# Patient Record
Sex: Female | Born: 1995 | Race: White | Hispanic: No | Marital: Single | Smoking: Current every day smoker
Health system: Southern US, Community
[De-identification: ages and names within clinical notes are randomized; demographics above are authoritative.]

---

## 2018-01-18 DIAGNOSIS — O99332 Smoking (tobacco) complicating pregnancy, second trimester: Secondary | ICD-10-CM | POA: Insufficient documentation

## 2018-01-18 DIAGNOSIS — O4692 Antepartum hemorrhage, unspecified, second trimester: Secondary | ICD-10-CM | POA: Insufficient documentation

## 2018-01-18 DIAGNOSIS — Z3201 Encounter for pregnancy test, result positive: Secondary | ICD-10-CM | POA: Insufficient documentation

## 2018-01-18 DIAGNOSIS — F17200 Nicotine dependence, unspecified, uncomplicated: Secondary | ICD-10-CM | POA: Insufficient documentation

## 2018-01-18 DIAGNOSIS — Z79899 Other long term (current) drug therapy: Secondary | ICD-10-CM | POA: Insufficient documentation

## 2018-01-19 ENCOUNTER — Telehealth: Payer: Self-pay | Admitting: Obstetrics & Gynecology

## 2018-01-19 ENCOUNTER — Emergency Department (HOSPITAL_COMMUNITY)
Admission: EM | Admit: 2018-01-19 | Discharge: 2018-01-19 | Disposition: A | Discharge status: Home / Self Care | Payer: Self-pay | Attending: Emergency Medicine | Admitting: Emergency Medicine

## 2018-01-19 ENCOUNTER — Emergency Department (HOSPITAL_COMMUNITY): Payer: Self-pay

## 2018-01-19 ENCOUNTER — Other Ambulatory Visit: Payer: Self-pay

## 2018-01-19 ENCOUNTER — Encounter (HOSPITAL_COMMUNITY): Payer: Self-pay | Admitting: *Deleted

## 2018-01-19 DIAGNOSIS — N939 Abnormal uterine and vaginal bleeding, unspecified: Secondary | ICD-10-CM

## 2018-01-19 DIAGNOSIS — O4690 Antepartum hemorrhage, unspecified, unspecified trimester: Secondary | ICD-10-CM

## 2018-01-19 LAB — URINALYSIS, ROUTINE W REFLEX MICROSCOPIC
Bilirubin Urine: NEGATIVE
GLUCOSE, UA: NEGATIVE mg/dL
HGB URINE DIPSTICK: NEGATIVE
Ketones, ur: 5 mg/dL — AB
Leukocytes, UA: NEGATIVE
Nitrite: NEGATIVE
PH: 6 (ref 5.0–8.0)
Protein, ur: NEGATIVE mg/dL
SPECIFIC GRAVITY, URINE: 1.027 (ref 1.005–1.030)

## 2018-01-19 LAB — GC/CHLAMYDIA PROBE AMP (~~LOC~~) NOT AT ARMC
CHLAMYDIA, DNA PROBE: NEGATIVE
Neisseria Gonorrhea: NEGATIVE

## 2018-01-19 LAB — WET PREP, GENITAL
Sperm: NONE SEEN
TRICH WET PREP: NONE SEEN
YEAST WET PREP: NONE SEEN

## 2018-01-19 LAB — POC URINE PREG, ED: Preg Test, Ur: POSITIVE — AB

## 2018-01-19 MED ORDER — SODIUM CHLORIDE 0.9 % IV BOLUS: 2000.0000 mL | Freq: Once | INTRAVENOUS | Status: DC

## 2018-01-19 MED ORDER — PRENATAL COMPLETE 14-0.4 MG PO TABS: 2.0000 | ORAL_TABLET | Freq: Every day | ORAL | 0 refills | Status: AC

## 2018-01-19 MED ORDER — SODIUM CHLORIDE 0.9 % IV BOLUS
1000.0000 mL | Freq: Once | INTRAVENOUS | Status: AC
  Administered 2018-01-19: 1000 mL via INTRAVENOUS

## 2018-01-19 NOTE — ED Provider Notes (Signed)
[facility]-EMERGENCY DEPT Provider Note   CSN: [id number] Arrival date & time: 01/18/18  2345     History   Chief Complaint Chief Complaint  Patient presents with  . Abdominal Pain    HPI Ellen Walls is a 22 y.o. female G2P1001 with a hx of no major medical problems presents to the Emergency Department complaining of gradual, persistent but improved lower abdominal cramping and one episode of vaginal bleeding.  Describes this as scant, dark blood.  She reports associated decreased fetal movement for the last 2 days.  She denies leakage of fluids.  Patient reports the last time she urinated she did not see blood.  She denies blood with urination or on the toilet paper after urination.  She denies vaginal discharge, dysuria, hematuria.  Patient reports that she has moved frequently over the last few months and therefore has not established an OB/GYN.  She reports her prenatal care has come from multiple different emergency rooms.  She reports that she does not have that paperwork with her.  She states she is [redacted] weeks pregnant.  Patient denies known aggravating or alleviating factors.  She reports she is drinking " enough" water but when questioned about this it seems she is drinking 5-6 bottles of 12 ounce water per day.  She denies fever, chills, headache, neck pain, chest pain, shortness of breath, diarrhea, weakness, dizziness, syncope.  Patient does report that she often vomits in the middle of the night due to her severe acid reflux.  Pt denies recent sexual intercourse.    The history is provided by the patient, medical records and a significant other. No language interpreter was used.    History reviewed. No pertinent past medical history.  There are no active problems to display for this patient.   History reviewed. No pertinent surgical history.   OB History    Gravida  1   Para      Term      Preterm      AB      Living        SAB      TAB      Ectopic      Multiple      Live Births               Home Medications    Prior to Admission medications   Medication Sig Start Date End Date Taking? Authorizing Provider  Prenatal Vit-Fe Fumarate-FA (PRENATAL COMPLETE) 14-0.4 MG TABS Take 2 tablets by mouth daily. 01/19/18   Kroy Sprung, Dahlia Client, PA-C    Family History No family history on file.  Social History Social History   Tobacco Use  . Smoking status: Current Every Day Smoker  Substance Use Topics  . Alcohol use: Never    Frequency: Never  . Drug use: Never     Allergies   Patient has no allergy information on record.   Review of Systems Review of Systems  Constitutional: Negative for appetite change, diaphoresis, fatigue, fever and unexpected weight change.  HENT: Negative for mouth sores.   Eyes: Negative for visual disturbance.  Respiratory: Negative for cough, chest tightness, shortness of breath and wheezing.   Cardiovascular: Negative for chest pain.  Gastrointestinal: Positive for abdominal pain and vomiting. Negative for constipation, diarrhea and nausea.  Endocrine: Negative for polydipsia, polyphagia and polyuria.  Genitourinary: Positive for vaginal bleeding. Negative for dysuria, frequency, hematuria and urgency.  Musculoskeletal: Negative for back pain and neck stiffness.  Skin:  Negative for rash.  Allergic/Immunologic: Negative for immunocompromised state.  Neurological: Negative for syncope, light-headedness and headaches.  Hematological: Does not bruise/bleed easily.  Psychiatric/Behavioral: Negative for sleep disturbance. The patient is not nervous/anxious.      Physical Exam Updated Vital Signs BP 117/64 (BP Location: Right Arm)   Pulse 86   Temp 98.8 F (37.1 C) (Oral)   Resp 20   Ht 5\' 2"  (1.575 m)   Wt 90.7 kg   LMP 07/17/2017   SpO2 100%   BMI 36.58 kg/m   Physical Exam  Constitutional: She appears well-developed and well-nourished. No distress.  Awake,  alert, nontoxic appearance  HENT:  Head: Normocephalic and atraumatic.  Mouth/Throat: Oropharynx is clear and moist. No oropharyngeal exudate.  Eyes: Conjunctivae are normal. No scleral icterus.  Neck: Normal range of motion. Neck supple.  Cardiovascular: Normal rate, regular rhythm and intact distal pulses.  Pulmonary/Chest: Effort normal and breath sounds normal. No respiratory distress. She has no wheezes.  Equal chest expansion  Abdominal: Soft. Bowel sounds are normal. She exhibits no mass. There is no tenderness. There is no rebound and no guarding. Hernia confirmed negative in the right inguinal area and confirmed negative in the left inguinal area.  Gravid uterus palpable No tenderness to palpation  Genitourinary: Pelvic exam was performed with patient supine. No labial fusion. There is no rash, tenderness, lesion or injury on the right labia. There is no rash, tenderness, lesion or injury on the left labia. Cervix exhibits no friability. Right adnexum displays no mass, no tenderness and no fullness. Left adnexum displays no mass, no tenderness and no fullness. No erythema, tenderness or bleeding ( No blood in the vaginal vault) in the vagina. No foreign body in the vagina. No signs of injury around the vagina. Vaginal discharge ( Leukorrhea noted) found.  Genitourinary Comments: Cervix is not friable.  Cervical Os is closed  Musculoskeletal: Normal range of motion. She exhibits no edema.  Lymphadenopathy: No inguinal adenopathy noted on the right or left side.  Neurological: She is alert.  Speech is clear and goal oriented Moves extremities without ataxia  Skin: Skin is warm and dry. She is not diaphoretic.  Psychiatric: She has a normal mood and affect.  Nursing note and vitals reviewed.    ED Treatments / Results  Labs (all labs ordered are listed, but only abnormal results are displayed) Labs Reviewed  WET PREP, GENITAL - Abnormal; Notable for the following components:       Result Value   Clue Cells Wet Prep HPF POC PRESENT (*)    WBC, Wet Prep HPF POC FEW (*)    All other components within normal limits  URINALYSIS, ROUTINE W REFLEX MICROSCOPIC - Abnormal; Notable for the following components:   APPearance CLOUDY (*)    Ketones, ur 5 (*)    All other components within normal limits  POC URINE PREG, ED - Abnormal; Notable for the following components:   Preg Test, Ur POSITIVE (*)    All other components within normal limits  GC/CHLAMYDIA PROBE AMP ([place]) NOT AT [facility]    Radiology US Ob Limited  Result Date: 01/19/2018 CLINICAL DATA:  Pregnant patient with vaginal bleeding. Estimated gestational age by first ultrasound per patient 26 weeks 4 days with [facility] 04/23/2018. [place] and has not established OB care. EXAM: LIMITED OBSTETRIC ULTRASOUND FINDINGS: Number of Fetuses: 1 Heart Rate:  156 bpm Movement: Yes Presentation: Breech Placental Location: Posterior Previa: No Amniotic Fluid (  Subjective):  Within normal limits. BPD: 5.93 cm 24 w  2 d MATERNAL FINDINGS: Cervix:  Appears closed. Uterus/Adnexae: Limited visualization due to gestational age. No abnormality visualized. IMPRESSION: Single live intrauterine pregnancy estimated gestational age [redacted] weeks 2 days based on biparietal diameter. No evident complication. This exam is performed on an emergent basis and does not comprehensively evaluate fetal size, dating, or anatomy; follow-up complete OB US should be considered if further fetal assessment is warranted. Electronically Signed   By: Narda Rutherford M.D.   On: 01/19/2018 02:57    Procedures Procedures (including critical care time)  Medications Ordered in ED Medications  sodium chloride 0.9 % bolus 1,000 mL (0 mLs Intravenous Stopped 01/19/18 0338)     Initial Impression / Assessment and Plan / ED Course  I have reviewed the triage vital signs and the nursing notes.  Pertinent labs & imaging results that were available  during my care of the patient were reviewed by me and considered in my medical decision making (see chart for details).     Patient presents with mild abdominal cramping and complaints of vaginal bleeding.  Patient was assessed by rapid response OB nurse.  Tracing showed fetal heart rate approximately 150 and variable with intermittent accelerations but no decelerations.  No evidence of contractions.  Pelvic exam was without blood in the vaginal vault.  Cervical os is closed.  Limited ultrasound shows estimated gestational age of [redacted] weeks and 2 days.  Urinalysis is without evidence of urinary tract infection.  Patient's heart rate improved after fluids.  Rapid response OB nurse discussed the patient with Dr. Erin Fulling who is comfortable with discharge home and close follow-up.  This was discussed with the patient who agrees to establish prenatal care with the women's outpatient clinic.  Discussed reasons to return immediately to the emergency department.  Patient states understanding and is in agreement with the plan.  Final Clinical Impressions(s) / ED Diagnoses   Final diagnoses:  Vaginal bleeding  Vaginal bleeding during pregnancy    ED Discharge Orders         Ordered    Prenatal Vit-Fe Fumarate-FA (PRENATAL COMPLETE) 14-0.4 MG TABS  Daily     01/19/18 0348           Jasdeep Kepner, Dahlia Client, PA-C 01/19/18 1610    Geoffery Lyons, MD 01/19/18 [phone]

## 2018-01-19 NOTE — Telephone Encounter (Signed)
Called patient to get her scheduled for an appointment. Was not able to reach her on the phone. I will send a reminder letter of her appointment.

## 2018-01-19 NOTE — Progress Notes (Signed)
Pt is at [facility], stating minimal fetal movement for last 2 days, with one episode of scant spotting yesterday afternoon. No new bleeding since arrival, no LOF. NST done. Fetal heart tones 145bpm, with accelerations to 170bpm and no decelerations (tracing intermitent at times with monitor being hand held.) Fetal heart tones audible throughout up to 175bpm with returns to BL.  Dr Erin Fulling advised of pt here at [facility]. [place] and pelvic exam recommended.

## 2018-01-19 NOTE — ED Notes (Signed)
Patient is complaining of abdominal cramping. Patient has no blood in underwear at this time.

## 2018-01-19 NOTE — ED Notes (Signed)
Bed: WA17 Expected date:  Expected time:  Means of arrival:  Comments: Triage 1  

## 2018-01-19 NOTE — Discharge Instructions (Addendum)
1. Medications: prenatal vitamins (prescription provided if needed), usual home medications 2. Treatment: rest, drink plenty of fluids,  3. Follow Up: Please followup with the women's outpatient clinic as directed for discussion of your diagnoses and further evaluation after today's visit; if you do not have a primary care doctor use the resource guide provided to find one; Please return to the ER/MAU for pain, vaginal bleeding, decreased fetal movement or any other concerns.

## 2018-01-19 NOTE — ED Notes (Addendum)
RAPID OB NOTIFIED OF PT PRESENCE IN ED

## 2018-01-19 NOTE — ED Triage Notes (Signed)
Pt reports she is [redacted] weeks pregnant, no prenatal care, no fetal movement x 2 days. Abdominal cramping tonight with "light red blood" in underwear and now spotting with brown blood. She is G2P1.

## 2018-01-23 ENCOUNTER — Encounter: Payer: Self-pay | Admitting: Advanced Practice Midwife

## 2019-01-18 ENCOUNTER — Encounter (HOSPITAL_COMMUNITY): Payer: Self-pay

## 2020-05-03 IMAGING — US US OB LIMITED
1 series · 14 of 28 positions shown · non-contrast
Comparison: none

CLINICAL DATA: Pregnant patient with vaginal bleeding. Estimated
gestational age by first ultrasound per [REDACTED] weeks 4 days with
[facility] 04/23/2018. [place] and has not
established OB care.

EXAM:
LIMITED OBSTETRIC ULTRASOUND

[Series 1: us ob limited · 14 of 32 slices shown]
[im 2/32]
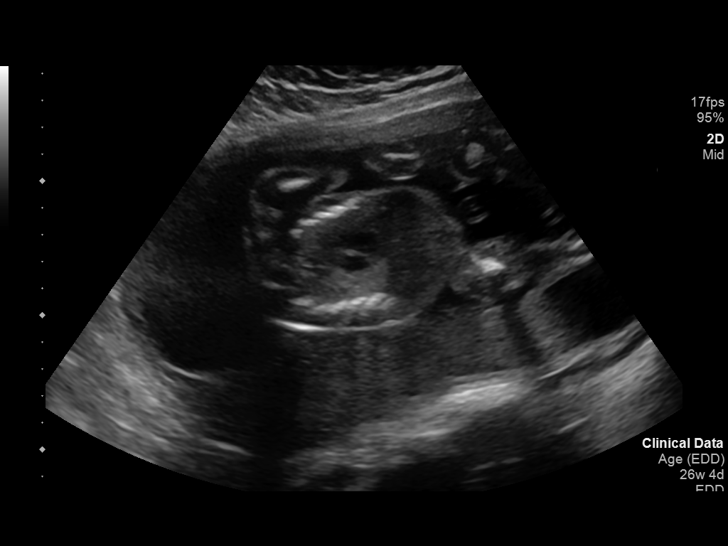
[im 4/32]
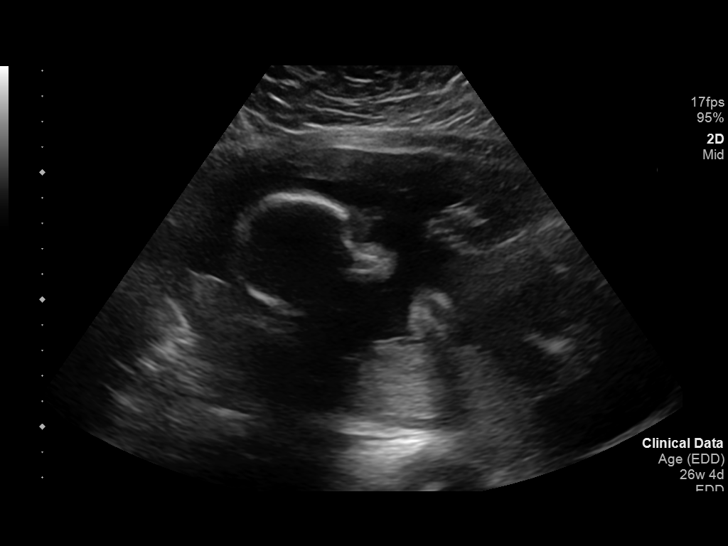
[im 6/32]
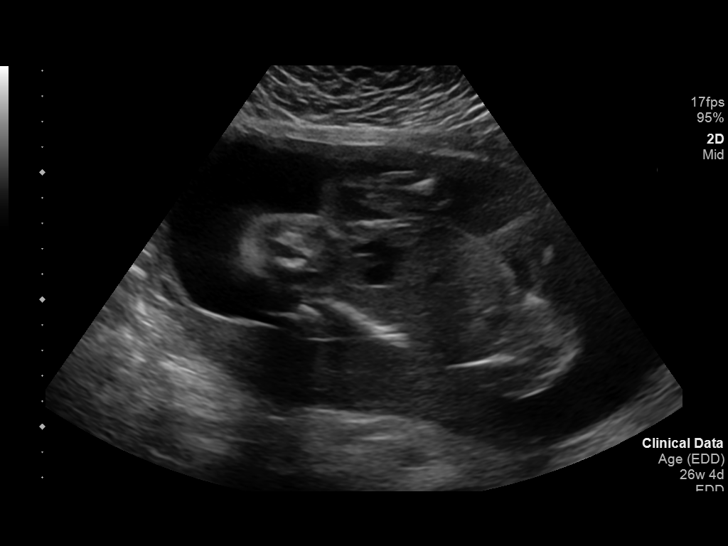
[im 9/32]
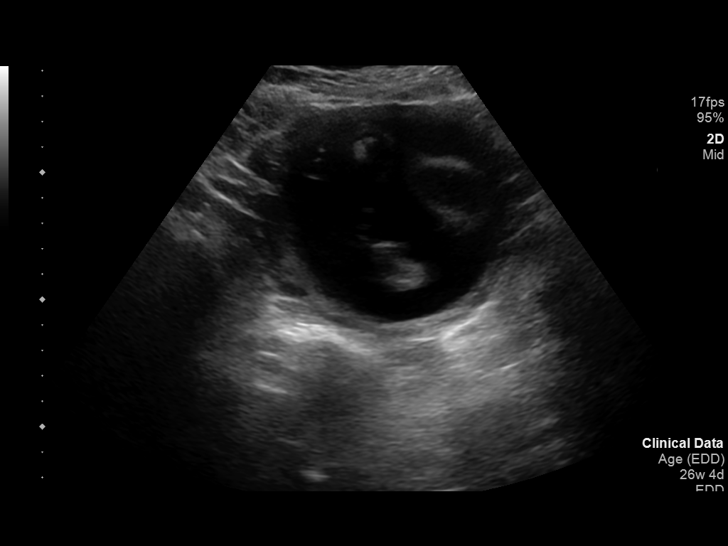
[im 11/32]
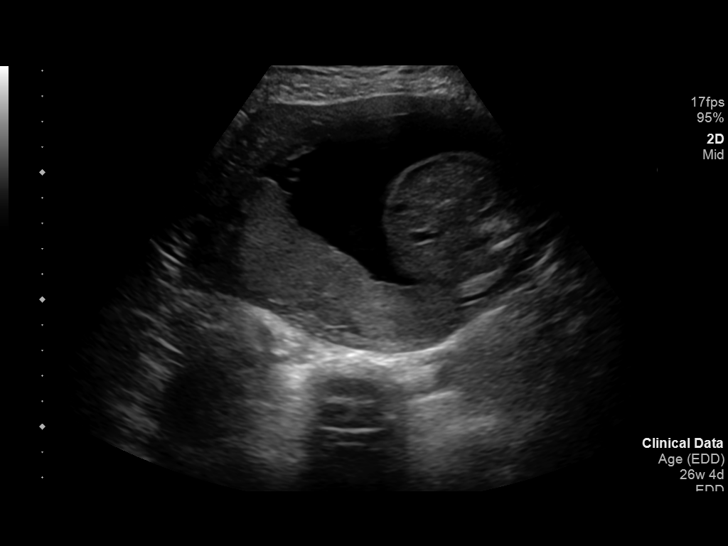
[im 13/32]
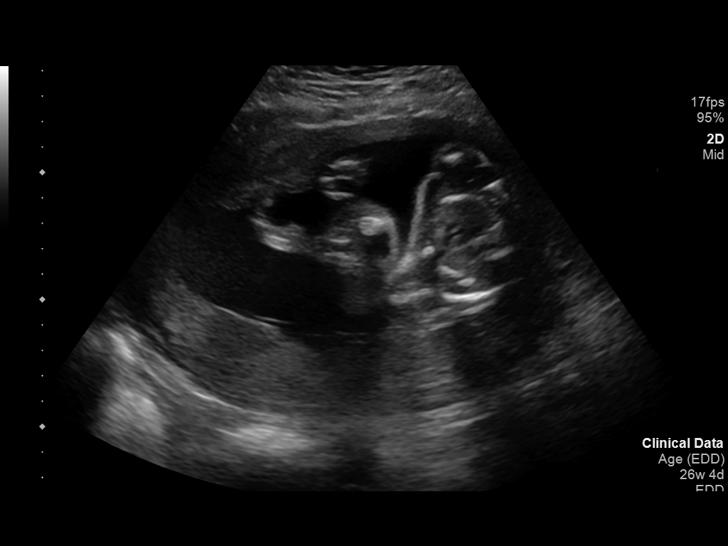
[im 15/32]
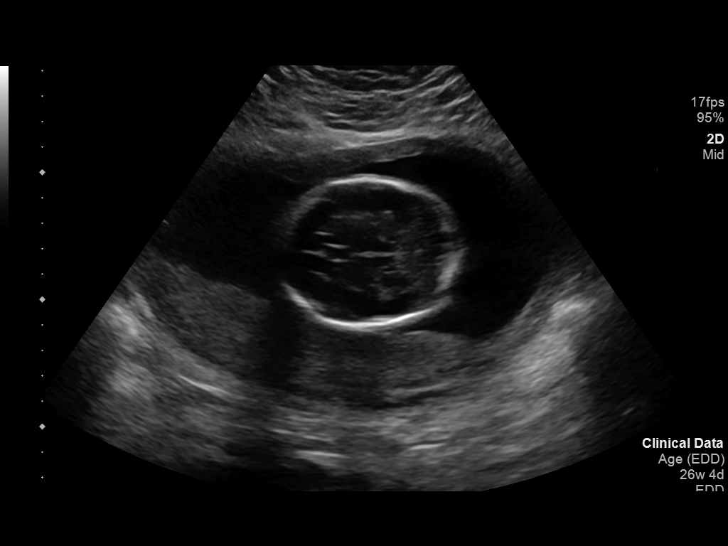
[im 18/32]
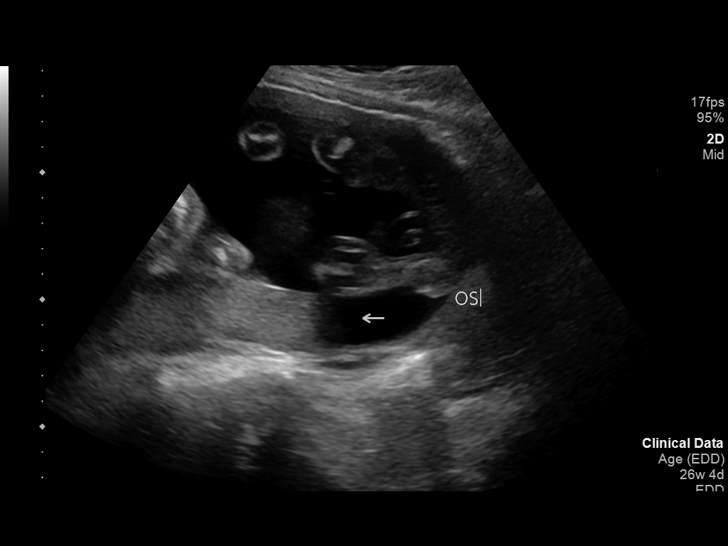
[im 20/32]
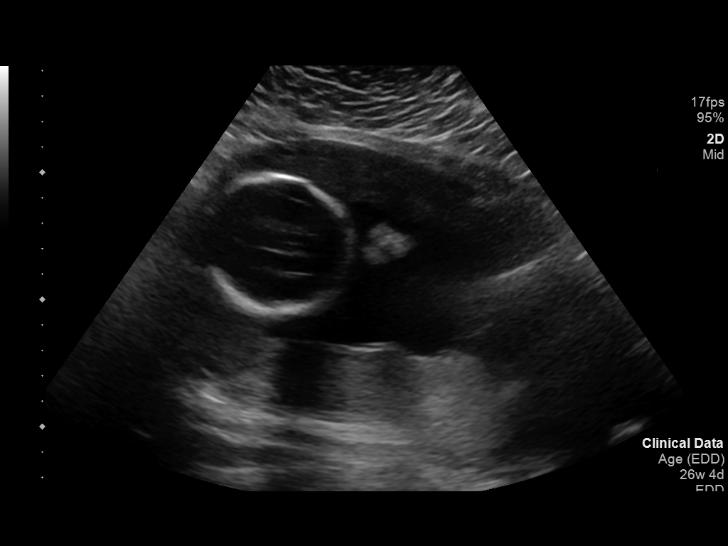
[im 22/32]
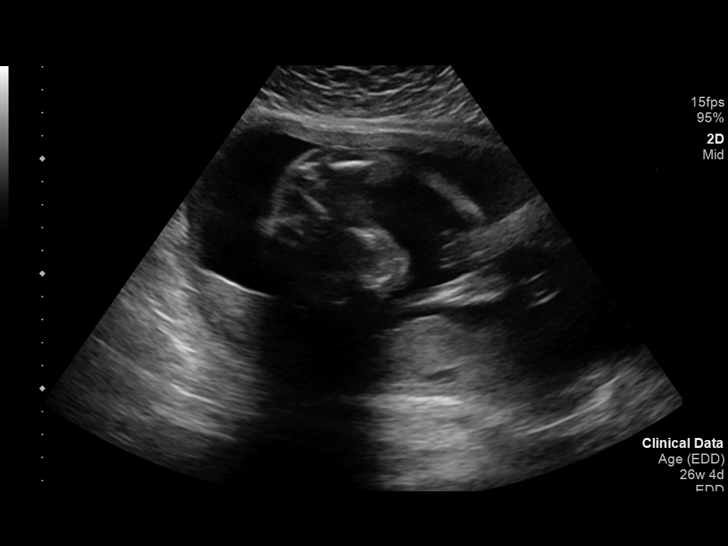
[im 25/32]
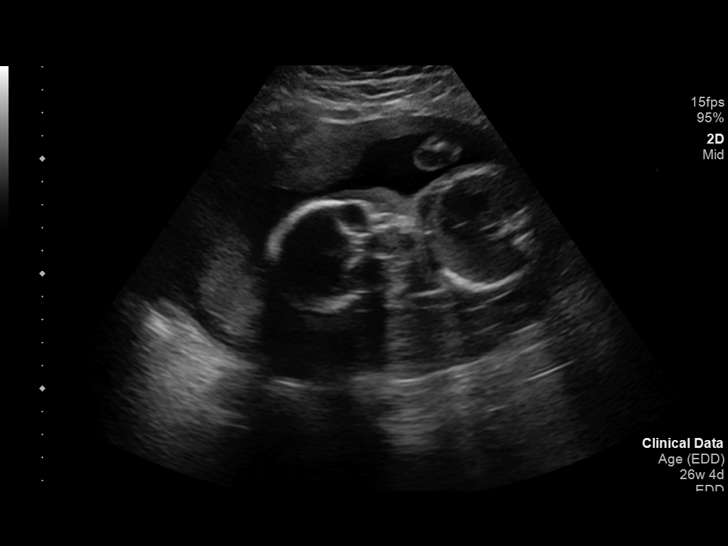
[im 27/32]
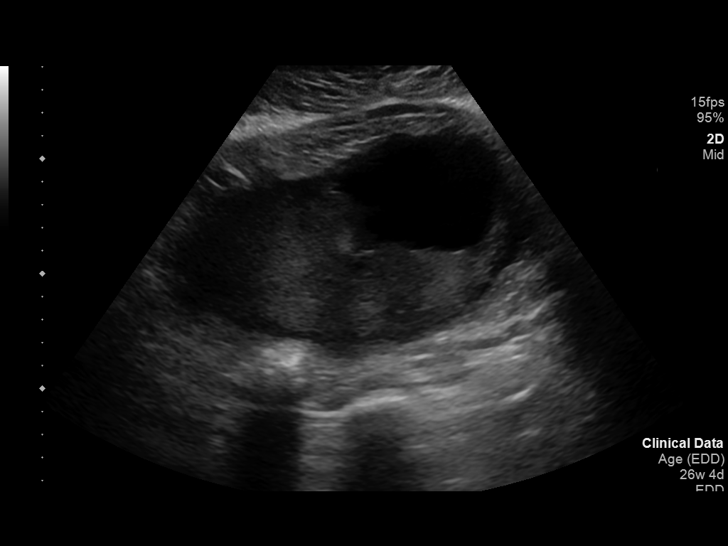
[im 29/32]
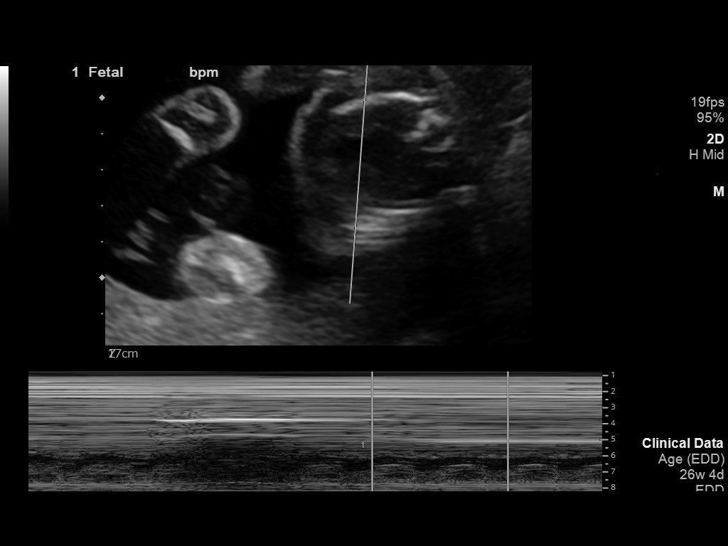
[im 32/32]
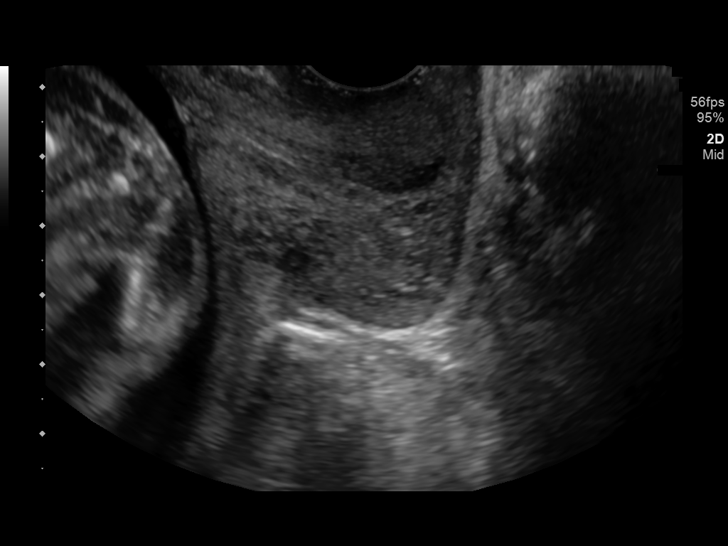

[14 of 28 positions shown; findings below may reference images not displayed]

FINDINGS: Number of Fetuses: 1

Heart Rate:  156 bpm

Movement: Yes

Presentation: Breech

Placental Location: Posterior

Previa: No

Amniotic Fluid (Subjective):  Within normal limits.

BPD: 5.93 cm 24 w  2 d

MATERNAL FINDINGS:

Cervix:  Appears closed.

Uterus/Adnexae: Limited visualization due to gestational age. No
abnormality visualized.
IMPRESSION: Single live intrauterine pregnancy estimated gestational age 24
weeks 2 days based on biparietal diameter. No evident complication.

This exam is performed on an emergent basis and does not
comprehensively evaluate fetal size, dating, or anatomy; follow-up
complete OB US should be considered if further fetal assessment is
warranted.
